# Patient Record
Sex: Male | Born: 1995 | Race: White | Hispanic: No | Marital: Single | Smoking: Never smoker
Health system: Southern US, Community
[De-identification: ages and names within clinical notes are randomized; demographics above are authoritative.]

---

## 2017-05-25 ENCOUNTER — Ambulatory Visit (HOSPITAL_COMMUNITY)
Admission: EM | Admit: 2017-05-25 | Discharge: 2017-05-25 | Disposition: A | Discharge status: Home / Self Care | Payer: Self-pay | Attending: Internal Medicine | Admitting: Internal Medicine

## 2017-05-25 ENCOUNTER — Encounter (HOSPITAL_COMMUNITY): Payer: Self-pay | Admitting: Emergency Medicine

## 2017-05-25 DIAGNOSIS — L03114 Cellulitis of left upper limb: Secondary | ICD-10-CM

## 2017-05-25 MED ORDER — SULFAMETHOXAZOLE-TRIMETHOPRIM 800-160 MG PO TABS: 1.0000 | ORAL_TABLET | Freq: Two times a day (BID) | ORAL | 0 refills | Status: AC

## 2017-05-25 MED ORDER — CEPHALEXIN 500 MG PO CAPS: 500.0000 mg | ORAL_CAPSULE | Freq: Four times a day (QID) | ORAL | 0 refills | Status: AC

## 2017-05-25 NOTE — Discharge Instructions (Signed)
Please pick up both antibiotics and take both. They [facility] be about $4 at [facility].   Bactrim covers staph species including MRSA Keflex covers strep species which are also found on skin.  Please do not continue to manipulate and pop areas as this can lead to spreading and reinfection.   Please return if symptoms not improving with treatment or symptoms worsening.

## 2017-05-25 NOTE — ED Provider Notes (Signed)
[facility]    CSN: [id number] Arrival date & time: 05/25/17  1817     History   Chief Complaint Chief Complaint  Patient presents with  . Abscess    HPI David Riddle is a 21 y.o. male presenting with multiple sores and area of erythema on his left upper extremity. States they are in various stages and have continued to pop up and spread. First lesion popped up about 1 month ago, which was shortly after he had got a tattoo on the shoulder of the same extremity. Painful, do not itch. Has popped them as he thought they were zit like, drained out significant amount of blood and pus. Has put neosporin on area. Continued to spread on same arm and to right axilla. Denies IV Drug use.   HPI  History reviewed. No pertinent past medical history.  There are no active problems to display for this patient.   History reviewed. No pertinent surgical history.     Home Medications    Prior to Admission medications   Not on File    Family History History reviewed. No pertinent family history.  Social History Social History   Tobacco Use  . Smoking status: Never Smoker  . Smokeless tobacco: Never Used  Substance Use Topics  . Alcohol use: No    Frequency: Never  . Drug use: No     Allergies   Patient has no known allergies.   Review of Systems Review of Systems  Constitutional: Negative for chills, fatigue and fever.  HENT: Negative for ear pain and sore throat.   Eyes: Negative for pain and visual disturbance.  Respiratory: Negative for cough and shortness of breath.   Cardiovascular: Negative for chest pain and palpitations.  Gastrointestinal: Negative for abdominal pain, nausea and vomiting.  Genitourinary: Negative for dysuria and hematuria.  Musculoskeletal: Negative for arthralgias, back pain, joint swelling, myalgias and neck pain.  Skin: Positive for color change and wound. Negative for rash.  Neurological: Negative for dizziness, syncope,  light-headedness and headaches.  All other systems reviewed and are negative.    Physical Exam Triage Vital Signs ED Triage Vitals [05/25/17 1842]  Enc Vitals Group     BP 120/78     Pulse Rate 93     Resp 18     Temp 98.8 F (37.1 C)     Temp Source Oral     SpO2 96 %     Weight      Height      Head Circumference      Peak Flow      Pain Score 4     Pain Loc      Pain Edu?      Excl. in GC?    No data found.  Updated Vital Signs BP 120/78 (BP Location: Right Arm)   Pulse 93   Temp 98.8 F (37.1 C) (Oral)   Resp 18   SpO2 96%   Visual Acuity Right Eye Distance:   Left Eye Distance:   Bilateral Distance:    Right Eye Near:   Left Eye Near:    Bilateral Near:     Physical Exam  Constitutional: He is oriented to person, place, and time. He appears well-developed and well-nourished.  HENT:  Head: Normocephalic and atraumatic.  Eyes: Conjunctivae are normal.  Neck: Neck supple.  Cardiovascular: Normal rate and regular rhythm.  No murmur heard. Pulmonary/Chest: Effort normal and breath sounds normal. No respiratory distress.  Abdominal: Soft. There  is no tenderness.  Musculoskeletal: He exhibits no edema.  Neurological: He is alert and oriented to person, place, and time.  Skin: Skin is warm and dry.  Multiple areas of erythema in various stages of healing.   Psychiatric: He has a normal mood and affect.  Nursing note and vitals reviewed.  Initial lesion with dry crusting, scabbing and minimal erythema surrounding on left upper extremity, inferior to posterior elbow.   Secondary lesion, on left upper extremity shoulder, surrounding peeling, central area of depression with hardened yellow area- possibly from him popping and the pus drying.   Newest Lesion- small papule on medial elbow, with significant surrounding erythema.     UC Treatments / Results  Labs (all labs ordered are listed, but only abnormal results are displayed) Labs Reviewed - No data  to display  EKG  EKG Interpretation None       Radiology No results found.  Procedures Procedures (including critical care time)  Medications Ordered in UC Medications - No data to display   Initial Impression / Assessment and Plan / UC Course  I have reviewed the triage vital signs and the nursing notes.  Pertinent labs & imaging results that were available during my care of the patient were reviewed by me and considered in my medical decision making (see chart for details).     Skin lesions likely cellulitis, possible MRSA involvement. Patient given oral Bactrim and Keflex for cover MRSA. 7 day course of each prescribed. Mom requesting refill, advised that treatment should clear infection and if he were to have additional lesions we would want to re-evaluate him.   Final Clinical Impressions(s) / UC Diagnoses   Final diagnoses:  None    ED Discharge Orders    None       Controlled Substance Prescriptions [place] Controlled Substance Registry consulted? Not Applicable   Lew DawesWieters, Hallie C, [place]-C 05/25/17 2050

## 2017-05-25 NOTE — ED Notes (Signed)
Patient discharged by provider.

## 2017-05-25 NOTE — ED Triage Notes (Signed)
Pt sts multiple small abscesses to arms and axillary area

## 2019-09-13 ENCOUNTER — Emergency Department (HOSPITAL_BASED_OUTPATIENT_CLINIC_OR_DEPARTMENT_OTHER): Payer: No Typology Code available for payment source

## 2019-09-13 ENCOUNTER — Emergency Department (HOSPITAL_BASED_OUTPATIENT_CLINIC_OR_DEPARTMENT_OTHER)
Admission: EM | Admit: 2019-09-13 | Discharge: 2019-09-13 | Disposition: A | Discharge status: Home / Self Care | Payer: No Typology Code available for payment source | Attending: Emergency Medicine | Admitting: Emergency Medicine

## 2019-09-13 ENCOUNTER — Other Ambulatory Visit: Payer: Self-pay

## 2019-09-13 ENCOUNTER — Encounter (HOSPITAL_BASED_OUTPATIENT_CLINIC_OR_DEPARTMENT_OTHER): Payer: Self-pay | Admitting: Emergency Medicine

## 2019-09-13 DIAGNOSIS — R519 Headache, unspecified: Secondary | ICD-10-CM | POA: Diagnosis not present

## 2019-09-13 DIAGNOSIS — M5020 Other cervical disc displacement, unspecified cervical region: Secondary | ICD-10-CM

## 2019-09-13 DIAGNOSIS — M5126 Other intervertebral disc displacement, lumbar region: Secondary | ICD-10-CM | POA: Diagnosis not present

## 2019-09-13 DIAGNOSIS — M545 Low back pain: Secondary | ICD-10-CM | POA: Diagnosis present

## 2019-09-13 MED ORDER — BACLOFEN 10 MG PO TABS: 10.0000 mg | ORAL_TABLET | Freq: Three times a day (TID) | ORAL | 0 refills | Status: AC | PRN

## 2019-09-13 MED ORDER — IBUPROFEN 200 MG PO TABS: 800.0000 mg | ORAL_TABLET | Freq: Three times a day (TID) | ORAL | 0 refills | Status: AC
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
## 2019-09-13 NOTE — ED Notes (Signed)
Patient transported to X-ray 

## 2019-09-13 NOTE — ED Notes (Signed)
Transported to CT 
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
## 2019-09-13 NOTE — Discharge Instructions (Addendum)
Based on the imaging we did while you were here, it looks like you do have a herniated disc in your lower back that is responsible for some of the sharp and shooting pains you have been describing.  This is not a problem that requires surgery at this time.  We will send you home with anti-inflammatory medication and muscle relaxers.  Please do establish care with a primary care physician. You can use instructions attached to this sheet or you can work through the [place] to establish with a primary care physician. I think that will be helpful as you may have some persistent pain for the next several weeks or months.

## 2019-09-26 DIAGNOSIS — M5126 Other intervertebral disc displacement, lumbar region: Secondary | ICD-10-CM | POA: Insufficient documentation

## 2019-12-30 ENCOUNTER — Emergency Department (HOSPITAL_COMMUNITY): Payer: No Typology Code available for payment source

## 2019-12-30 ENCOUNTER — Encounter (HOSPITAL_COMMUNITY): Payer: Self-pay

## 2019-12-30 ENCOUNTER — Emergency Department (HOSPITAL_COMMUNITY)
Admission: EM | Admit: 2019-12-30 | Discharge: 2019-12-30 | Disposition: A | Discharge status: Home / Self Care | Payer: No Typology Code available for payment source | Attending: Emergency Medicine | Admitting: Emergency Medicine

## 2019-12-30 ENCOUNTER — Other Ambulatory Visit: Payer: Self-pay

## 2019-12-30 DIAGNOSIS — S00512A Abrasion of oral cavity, initial encounter: Secondary | ICD-10-CM | POA: Diagnosis not present

## 2019-12-30 DIAGNOSIS — S20312A Abrasion of left front wall of thorax, initial encounter: Secondary | ICD-10-CM | POA: Diagnosis not present

## 2019-12-30 DIAGNOSIS — H539 Unspecified visual disturbance: Secondary | ICD-10-CM | POA: Insufficient documentation

## 2019-12-30 DIAGNOSIS — M542 Cervicalgia: Secondary | ICD-10-CM | POA: Insufficient documentation

## 2019-12-30 DIAGNOSIS — S6991XA Unspecified injury of right wrist, hand and finger(s), initial encounter: Secondary | ICD-10-CM | POA: Diagnosis present

## 2019-12-30 DIAGNOSIS — Y999 Unspecified external cause status: Secondary | ICD-10-CM | POA: Insufficient documentation

## 2019-12-30 DIAGNOSIS — S62001A Unspecified fracture of navicular [scaphoid] bone of right wrist, initial encounter for closed fracture: Secondary | ICD-10-CM | POA: Insufficient documentation

## 2019-12-30 DIAGNOSIS — Y9355 Activity, bike riding: Secondary | ICD-10-CM | POA: Diagnosis not present

## 2019-12-30 DIAGNOSIS — R42 Dizziness and giddiness: Secondary | ICD-10-CM | POA: Diagnosis not present

## 2019-12-30 DIAGNOSIS — Y92410 Unspecified street and highway as the place of occurrence of the external cause: Secondary | ICD-10-CM | POA: Insufficient documentation

## 2019-12-30 MED ORDER — METHOCARBAMOL 750 MG PO TABS: 750.0000 mg | ORAL_TABLET | Freq: Every evening | ORAL | 0 refills | Status: AC | PRN

## 2019-12-30 MED ORDER — BACITRACIN ZINC 500 UNIT/GM EX OINT
TOPICAL_OINTMENT | CUTANEOUS | Status: AC
  Filled 2019-12-30: qty 2.7

## 2019-12-30 MED ORDER — BACITRACIN ZINC 500 UNIT/GM EX OINT: TOPICAL_OINTMENT | Freq: Once | CUTANEOUS | Status: DC

## 2019-12-30 MED ORDER — IBUPROFEN 400 MG PO TABS: 400.0000 mg | ORAL_TABLET | Freq: Four times a day (QID) | ORAL | 0 refills | Status: AC | PRN

## 2019-12-30 MED ORDER — ACETAMINOPHEN 325 MG PO TABS
650.0000 mg | ORAL_TABLET | Freq: Once | ORAL | Status: AC
  Administered 2019-12-30: 20:00:00 650 mg via ORAL
  Filled 2019-12-30: qty 2

## 2019-12-30 MED ORDER — HYDROCODONE-ACETAMINOPHEN 5-325 MG PO TABS
1.0000 | ORAL_TABLET | Freq: Once | ORAL | Status: AC
Start: 2019-12-30 — End: 2019-12-30
  Administered 2019-12-30: 22:00:00 1 via ORAL
  Filled 2019-12-30: qty 1

## 2019-12-30 NOTE — Discharge Instructions (Addendum)
You may alternate taking Tylenol and Ibuprofen as needed for pain control. You may take 400-600 mg of ibuprofen every 6 hours and [phone] mg of Tylenol every 6 hours. Do not exceed 4000 mg of Tylenol daily as this can lead to liver damage. Also, make sure to take Ibuprofen with meals as it can cause an upset stomach. Do not take other NSAIDs while taking Ibuprofen such as (Aleve, Naprosyn, Aspirin, Celebrex, etc) and do not take more than the prescribed dose as this can lead to ulcers and bleeding in your GI tract. You may use warm and cold compresses to help with your symptoms.   You were given a prescription for Robaxin which is a muscle relaxer.  You should not drive, work, or operate machinery while taking this medication as it can make you very drowsy.    Please follow up with the orthopedic doctor within the next 7-10 days for re-evaluation and further treatment of your symptoms. You will need to call the office to schedule an appointment for follow up.   Please return to the ER sooner if you have any new or worsening symptoms.

## 2019-12-30 NOTE — ED Notes (Signed)
Patient transported to CT 
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
## 2019-12-30 NOTE — ED Notes (Signed)
Bandages applied to L arm, elbow, wrist and L knee. Wounds cleansed prior with saline
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
## 2020-07-13 ENCOUNTER — Encounter: Payer: Self-pay | Admitting: Sports Medicine

## 2020-07-13 ENCOUNTER — Ambulatory Visit: Payer: BC Managed Care – PPO | Admitting: Sports Medicine

## 2020-07-13 ENCOUNTER — Other Ambulatory Visit: Payer: Self-pay

## 2020-07-13 DIAGNOSIS — B070 Plantar wart: Secondary | ICD-10-CM | POA: Diagnosis not present

## 2020-07-13 DIAGNOSIS — M79672 Pain in left foot: Secondary | ICD-10-CM

## 2020-07-13 NOTE — Progress Notes (Signed)
Subjective: David Riddle is a 25 y.o. male patient who presents to office for evaluation of Left foot pain secondary to moderately painful wart at the left heel x 7 months started out as 1 lesion but now has 2. Patient has tried self digging and OTC meds with no relief in symptoms. Patient denies any other pedal complaints.   Pain to heel 8/10 when walking, walks on tip toes due to pain.  +Smoker, pot every night   There are no problems to display for this patient.   Current Outpatient Medications on File Prior to Visit  Medication Sig Dispense Refill  . ibuprofen (ADVIL) 400 MG tablet Take 1 tablet (400 mg total) by mouth every 6 (six) hours as needed. 30 tablet 0   No current facility-administered medications on file prior to visit.    Allergies  Allergen Reactions  . Diphenhydramine Other (See Comments)    hyperactivity  . Zolpidem Other (See Comments)    unknown    Objective:  General: Alert and oriented x3 in no acute distress  Dermatology: Keratotic lesion present measuring <0.5cm x 2 at left heel with no skin lines transversing the lesions, mild pain is present with medial lateral pressure to the lesion, capillaries with pin point bleeding noted, no webspace macerations, no ecchymosis bilateral, all nails x 10 are well manicured.  Vascular: Dorsalis Pedis and Posterior Tibial pedal pulses 2/4, Capillary Fill Time 3 seconds, + pedal hair growth bilateral, no edema bilateral lower extremities, Temperature gradient within normal limits.  Neurology: Michaell Cowing sensation intact via light touch bilateral.  Musculoskeletal: Mild tenderness with palpation at the lesion site on Left heel, Muscular strength 5/5 in all groups without pain or limitation on range of motion. No lower extremity muscular or boney deformity noted.  Assessment and Plan: Problem List Items Addressed This Visit   None   Visit Diagnoses    Plantar wart    -  Primary   Pain of left heel          -Complete  examination performed -Discussed treatment options for wart -Parred keratoic warty lesion x2 using a chisel blade; treated the area with Catharidin covered with bandaid; Advised patient of blistering reaction that will occur from application of medication and once this happens replace bandaid with neosporin and tape/bandaid -Advised good hygiene habits and to change socks frequently  -Patient to return to office in 3-4 weeks or sooner if condition worsens.  Asencion Islam, DPM

## 2020-08-04 ENCOUNTER — Encounter: Payer: Self-pay | Admitting: Sports Medicine

## 2020-08-04 ENCOUNTER — Ambulatory Visit: Payer: BC Managed Care – PPO | Admitting: Sports Medicine

## 2020-08-04 ENCOUNTER — Other Ambulatory Visit: Payer: Self-pay

## 2020-08-04 DIAGNOSIS — F319 Bipolar disorder, unspecified: Secondary | ICD-10-CM | POA: Insufficient documentation

## 2020-08-04 DIAGNOSIS — L309 Dermatitis, unspecified: Secondary | ICD-10-CM

## 2020-08-04 DIAGNOSIS — H6690 Otitis media, unspecified, unspecified ear: Secondary | ICD-10-CM | POA: Insufficient documentation

## 2020-08-04 DIAGNOSIS — B070 Plantar wart: Secondary | ICD-10-CM

## 2020-08-04 DIAGNOSIS — M79672 Pain in left foot: Secondary | ICD-10-CM

## 2020-08-04 DIAGNOSIS — F909 Attention-deficit hyperactivity disorder, unspecified type: Secondary | ICD-10-CM | POA: Insufficient documentation

## 2020-08-04 MED ORDER — NYSTATIN-TRIAMCINOLONE 100000-0.1 UNIT/GM-% EX OINT: 1.0000 "application " | TOPICAL_OINTMENT | Freq: Every evening | CUTANEOUS | 0 refills | Status: DC | PRN

## 2020-08-04 NOTE — Progress Notes (Signed)
Subjective: David Riddle is a 25 y.o. male patient who returns to office for follow up evaluation of Left foot pain secondary to moderately painful wart at the left heel; reports that pain is better, does not hurt anymore after last treatment. Reports that now he has a rash at his hands and arches that sometimes flares and gets itchy. Patient denies any other pedal complaints.   Patient Active Problem List   Diagnosis Date Noted  . ADHD (attention deficit hyperactivity disorder) 08/04/2020  . Bipolar 1 disorder (HCC) 08/04/2020  . Chronic otitis media 08/04/2020  . 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 ibuprofen (ADVIL) 400 MG tablet Take 1 tablet (400 mg total) by mouth every 6 (six) hours as needed. 30 tablet 0   No current facility-administered medications on file prior to visit.    Allergies  Allergen Reactions  . Diphenhydramine Other (See Comments)    hyperactivity  . Zolpidem Other (See Comments)    unknown    Objective:  General: Alert and oriented x3 in no acute distress  Dermatology: Keratotic lesion present measuring <0.5cm x 2 at left heel with no skin lines transversing the lesions, minimal pain is present with medial lateral pressure to the lesion, capillaries with pin point bleeding noted, +rash to medial arch and hands, red, puritic in nature, no webspace macerations, no ecchymosis bilateral, all nails x 10 are well manicured.  Vascular: Dorsalis Pedis and Posterior Tibial pedal pulses 2/4, Capillary Fill Time 3 seconds, + pedal hair growth bilateral, no edema bilateral lower extremities, Temperature gradient within normal limits. +Hyperhyrodosis.   Neurology: Michaell Cowing sensation intact via light touch bilateral.  Musculoskeletal: Minimal tenderness with palpation at the lesion site on Left heel and rash, Muscular strength 5/5 in all groups without pain or limitation on range of  motion. No lower extremity muscular or boney deformity noted.  Assessment and Plan: Problem List Items Addressed This Visit   None   Visit Diagnoses    Plantar wart    -  Primary   Relevant Medications   nystatin-triamcinolone ointment (MYCOLOG)   Pain of left heel       Dermatitis           -Complete examination performed -Re-Discussed treatment options for wart and rash -Parred keratoic warty lesion x2 using a chisel blade; treated the area with Catharidin covered with bandaid; TREATMENT #2. Advised patient of blistering reaction that will occur from application of medication and once this happens replace bandaid with neosporin and tape/bandaid -Rx Mycolog cream for rash  -Advised good hygiene habits and to change socks frequently like before. -Patient to return to office in 3-4 weeks or sooner if condition worsens.  Asencion Islam, DPM

## 2020-08-05 ENCOUNTER — Other Ambulatory Visit: Payer: Self-pay | Admitting: Sports Medicine

## 2020-08-05 ENCOUNTER — Telehealth: Payer: Self-pay | Admitting: Sports Medicine

## 2020-08-05 MED ORDER — NYSTATIN-TRIAMCINOLONE 100000-0.1 UNIT/GM-% EX OINT: 1.0000 "application " | TOPICAL_OINTMENT | Freq: Every evening | CUTANEOUS | 0 refills | Status: AC | PRN

## 2020-08-05 NOTE — Telephone Encounter (Signed)
Thanks sent

## 2020-08-05 NOTE — Progress Notes (Signed)
Mycolog sent to [facility] pt

## 2020-08-05 NOTE — Telephone Encounter (Signed)
Patient has requested prescription be sent to new pharmacy, info has been updated in chart to reflect new location, Please Advise

## 2020-08-31 ENCOUNTER — Ambulatory Visit: Payer: BC Managed Care – PPO | Admitting: Sports Medicine

## 2021-07-04 ENCOUNTER — Other Ambulatory Visit: Payer: Self-pay | Admitting: Surgery

## 2021-12-31 ENCOUNTER — Emergency Department (HOSPITAL_BASED_OUTPATIENT_CLINIC_OR_DEPARTMENT_OTHER): Payer: BC Managed Care – PPO

## 2021-12-31 ENCOUNTER — Emergency Department (HOSPITAL_BASED_OUTPATIENT_CLINIC_OR_DEPARTMENT_OTHER)
Admission: EM | Admit: 2021-12-31 | Discharge: 2021-12-31 | Disposition: A | Discharge status: Home / Self Care | Payer: BC Managed Care – PPO | Attending: Emergency Medicine | Admitting: Emergency Medicine

## 2021-12-31 ENCOUNTER — Encounter (HOSPITAL_BASED_OUTPATIENT_CLINIC_OR_DEPARTMENT_OTHER): Payer: Self-pay | Admitting: Emergency Medicine

## 2021-12-31 DIAGNOSIS — S61211A Laceration without foreign body of left index finger without damage to nail, initial encounter: Secondary | ICD-10-CM | POA: Insufficient documentation

## 2021-12-31 DIAGNOSIS — S62631B Displaced fracture of distal phalanx of left index finger, initial encounter for open fracture: Secondary | ICD-10-CM | POA: Diagnosis not present

## 2021-12-31 DIAGNOSIS — S6992XA Unspecified injury of left wrist, hand and finger(s), initial encounter: Secondary | ICD-10-CM | POA: Diagnosis present

## 2021-12-31 DIAGNOSIS — W312XXA Contact with powered woodworking and forming machines, initial encounter: Secondary | ICD-10-CM | POA: Insufficient documentation

## 2021-12-31 MED ORDER — CEPHALEXIN 500 MG PO CAPS: 500.0000 mg | ORAL_CAPSULE | Freq: Four times a day (QID) | ORAL | 0 refills | Status: AC

## 2021-12-31 MED ORDER — CEFAZOLIN SODIUM 1 G IM
1.0000 g | Freq: Once | INTRAMUSCULAR | Status: AC
  Administered 2021-12-31: 1 g via INTRAMUSCULAR
  Filled 2021-12-31: qty 10

## 2021-12-31 MED ORDER — ACETAMINOPHEN 325 MG PO TABS
650.0000 mg | ORAL_TABLET | Freq: Once | ORAL | Status: AC
  Administered 2021-12-31: 650 mg via ORAL
  Filled 2021-12-31: qty 2

## 2021-12-31 MED ORDER — LIDOCAINE HCL (PF) 1 % IJ SOLN
10.0000 mL | Freq: Once | INTRAMUSCULAR | Status: AC
  Administered 2021-12-31: 10 mL
  Filled 2021-12-31: qty 10

## 2021-12-31 MED ORDER — IBUPROFEN 400 MG PO TABS
600.0000 mg | ORAL_TABLET | Freq: Once | ORAL | Status: AC
  Administered 2021-12-31: 600 mg via ORAL
  Filled 2021-12-31: qty 1

## 2021-12-31 NOTE — Discharge Instructions (Addendum)
Please use Tylenol or ibuprofen for pain.  You may use 600 mg ibuprofen every 6 hours or 1000 mg of Tylenol every 6 hours.  You may choose to alternate between the 2.  This would be most effective.  Not to exceed 4 g of Tylenol within 24 hours.  Not to exceed 3200 mg ibuprofen 24 hours.  Keep the dressing clean, dry, and place.  Please take the entire course of antibiotics.  The orthopedic office should give you a call on Monday, however if you have not heard from them by noon please call to ensure that you have scheduled an appointment with the hand surgeon.  If you notice any worsening redness, swelling, pus draining from the affected wound please return for further evaluation.
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
## 2021-12-31 NOTE — ED Provider Notes (Addendum)
[facility] EMERGENCY DEPARTMENT Provider Note   CSN: [id number] Arrival date & time: 12/31/21  1927     History  Chief Complaint  Patient presents with   Laceration    David Riddle is a 26 y.o. male with no significant past medical history who reports he is up-to-date on his tetanus who presents with laceration to the tip of the left index finger with saw.  Patient had a tourniquet placed on his forearm for 30 to 45 minutes prior to arrival.  Tourniquet was removed in triage.  On my assessment there is no active bleeding of the wound.  Laceration noted to the volar surface of the distal phalanx of the left index finger.  There does not seem to be any nail involvement.  Patient can flex and extend without difficulty.  He is endorsing significant pain.   Laceration      Home Medications Prior to Admission medications   Medication Sig Start Date End Date Taking? Authorizing Provider  cephALEXin (KEFLEX) 500 MG capsule Take 1 capsule (500 mg total) by mouth 4 (four) times daily. 12/31/21  Yes Fredonia Casalino H, PA-C  cephALEXin (KEFLEX) 500 MG capsule Take 1 capsule (500 mg total) by mouth 4 (four) times daily. 12/31/21  Yes Phylis Javed H, PA-C  ibuprofen (ADVIL) 400 MG tablet Take 1 tablet (400 mg total) by mouth every 6 (six) hours as needed. 12/30/19   Couture, Cortni S, PA-C  nystatin-triamcinolone ointment (MYCOLOG) Apply 1 application topically at bedtime as needed. To rash at arch and hands 08/05/20   Asencion Islam, DPM      Allergies    Diphenhydramine and Zolpidem    Review of Systems   Review of Systems  Skin:  Positive for wound.  All other systems reviewed and are negative.   Physical Exam Updated Vital Signs BP 120/79 (BP Location: Right Arm)   Pulse (!) 55   Temp 98.9 F (37.2 C) (Oral)   Resp 18   Ht 6\' 1"  (1.854 m)   Wt 68 kg   SpO2 98%   BMI 19.79 kg/m  Physical Exam Vitals and nursing note reviewed.  Constitutional:       General: He is not in acute distress.    Appearance: Normal appearance.  HENT:     Head: Normocephalic and atraumatic.  Eyes:     General:        Right eye: No discharge.        Left eye: No discharge.  Cardiovascular:     Rate and Rhythm: Normal rate and regular rhythm.     Pulses: Normal pulses.  Pulmonary:     Effort: Pulmonary effort is normal. No respiratory distress.  Musculoskeletal:        General: No deformity.     Comments: Strength to flexion, extension of the affected distal phalanx on left hand.  No other injuries noted.  Skin:    General: Skin is warm and dry.     Capillary Refill: Capillary refill takes less than 2 seconds.     Comments: Open laceration noted to volar surface of left index finger.  No active bleeding at time my evaluation.  No tendon involvement on full exploration of wound under anesthesia. No nailbed involvement on my exam.  Neurological:     Mental Status: He is alert and oriented to person, place, and time.  Psychiatric:        Mood and Affect: Mood normal.  Behavior: Behavior normal.     ED Results / Procedures / Treatments   Labs (all labs ordered are listed, but only abnormal results are displayed) Labs Reviewed - No data to display  EKG None  Radiology DG Finger Index Left  Result Date: 12/31/2021 CLINICAL DATA:  Laceration of the left index finger. EXAM: LEFT INDEX FINGER 2+V COMPARISON:  None Available. FINDINGS: There is a fracture of the distal phalanx of the index finger along the laceration with several small displaced fracture fragments. No other acute fracture. No dislocation. The bones are well mineralized. Laceration of the soft tissues of the distal index finger. No radiopaque foreign object. IMPRESSION: Fracture of the distal phalanx of the index finger along the laceration. Electronically Signed   By: Elgie Collard M.D.   On: 12/31/2021 20:20    Procedures .Marland KitchenLaceration Repair  Date/Time: 12/31/2021 9:59  PM  Performed by: Olene Floss, PA-C Authorized by: Olene Floss, PA-C   Consent:    Consent obtained:  Verbal   Consent given by:  Patient   Risks, benefits, and alternatives were discussed: yes     Risks discussed:  Infection, pain, poor wound healing, poor cosmetic result, need for additional repair, vascular damage, tendon damage and retained foreign body   Alternatives discussed:  No treatment Universal protocol:    Procedure explained and questions answered to patient or proxy's satisfaction: yes     Patient identity confirmed:  Verbally with patient Anesthesia:    Anesthesia method:  Nerve block   Block location:  Left index   Block needle gauge:  25 G   Block anesthetic:  Lidocaine 1% w/o epi   Block technique:  Modified transthecal   Block injection procedure:  Anatomic landmarks identified, introduced needle, negative aspiration for blood, incremental injection and anatomic landmarks palpated   Block outcome:  Anesthesia achieved Laceration details:    Location:  Finger   Finger location:  L index finger   Length (cm):  3.7   Depth (mm):  9 Treatment:    Area cleansed with:  Povidone-iodine and saline   Amount of cleaning:  Extensive   Irrigation solution:  Sterile saline Skin repair:    Repair method:  Sutures   Suture size:  4-0   Wound skin closure material used: Vicryl.   Suture technique:  Simple interrupted   Number of sutures:  8 Approximation:    Approximation:  Close Repair type:    Repair type:  Intermediate Post-procedure details:    Dressing:  Antibiotic ointment, non-adherent dressing and bulky dressing   Procedure completion:  Tolerated .Nerve Block  Date/Time: 12/31/2021 10:06 PM  Performed by: Olene Floss, PA-C Authorized by: Olene Floss, PA-C   Consent:    Consent obtained:  Verbal   Consent given by:  Patient   Risks, benefits, and alternatives were discussed: yes     Risks discussed:  Infection,  nerve damage, intravenous injection, pain, unsuccessful block, bleeding and allergic reaction   Alternatives discussed:  No treatment Universal protocol:    Procedure explained and questions answered to patient or proxy's satisfaction: yes     Patient identity confirmed:  Verbally with patient Indications:    Indications:  Procedural anesthesia and pain relief Location:    Body area:  Upper extremity   Upper extremity nerve blocked: digital block.   Laterality:  Left Pre-procedure details:    Skin preparation:  Povidone-iodine Skin anesthesia:    Skin anesthesia method:  Local infiltration  Local anesthetic:  Lidocaine 1% w/o epi Procedure details:    Block needle gauge:  25 G   Anesthetic injected:  Lidocaine 1% w/o epi   Steroid injected:  None   Injection procedure:  Anatomic landmarks identified, incremental injection, introduced needle, anatomic landmarks palpated and negative aspiration for blood   Paresthesia:  None Post-procedure details:    Dressing:  None   Outcome:  Anesthesia achieved   Procedure completion:  Tolerated     Medications Ordered in ED Medications  ibuprofen (ADVIL) tablet 600 mg (600 mg Oral Given 12/31/21 1957)  acetaminophen (TYLENOL) tablet 650 mg (650 mg Oral Given 12/31/21 1957)  lidocaine (PF) (XYLOCAINE) 1 % injection 10 mL (10 mLs Infiltration Given 12/31/21 1957)  ceFAZolin (ANCEF) injection 1 g (1 g Intramuscular Given 12/31/21 2118)    ED Course/ Medical Decision Making/ A&P Clinical Course as of 12/31/21 2204  Sat Dec 31, 2021  2138 3.7cm, 8 stitches [CP]    Clinical Course User Index [CP] Olene Floss, PA-C                           Medical Decision Making Amount and/or Complexity of Data Reviewed Radiology: ordered.  Risk OTC drugs. Prescription drug management.   This 26 year old male presents with traumatic injury to the left index finger with a circular saw just prior to arrival.  On arrival he has a large open  laceration with some minimal active bleeding of the distal portion of the left index finger.  It is not a circumferential laceration, there does not seem to be any nailbed involvement.  Did not see any foreign bodies on full exploration of the wound. I independently interpreted imaging including plain film radiographs of the left index finger which shows fracture underlying laceration with multiple displaced fragments. I agree with the radiologist interpretation.  I obtained consult with the hand surgeon, spoke with Dr. Janee Morn and discussed planning after review of radiographic imaging.  Dr. Janee Morn requests IM Ancef, repair in the emergency department today, bulky dressing, oral antibiotics and follow-up in his office next week.  Finger repaired as described above, antibiotics administered in the emergency department and provided on discharge.  Patient tolerated the procedure without difficulties.  He understands that he will need additional management of this open fracture with orthopedic surgery.  Stable condition at this time, return precautions given.  Patient with intact flexion, extension of the affected finger on evaluation, capillary refill intact after repair. Final Clinical Impression(s) / ED Diagnoses Final diagnoses:  Open displaced fracture of distal phalanx of left index finger, initial encounter  Laceration of left index finger without foreign body without damage to nail, initial encounter    Rx / DC Orders ED Discharge Orders          Ordered    cephALEXin (KEFLEX) 500 MG capsule  4 times daily        12/31/21 2139    cephALEXin (KEFLEX) 500 MG capsule  4 times daily        12/31/21 2151              Rajveer Handler, [place] H, PA-C 12/31/21 2204    [place] 12/31/21 2208    Benjiman Core, MD 12/31/21 2251

## 2021-12-31 NOTE — ED Notes (Signed)
Pt c/o severe pain, dressing removed, left pointer finger laceration at distal end on pad of finger. Bleeding controlled Finger placed in betadine solution to soak

## 2021-12-31 NOTE — ED Notes (Signed)
Wound care performed, bacitracin applied with telfa non-stick gauze, covered with kerlix and tube gauze applied. Pt tolerated well
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
